# Patient Record
Sex: Male | Born: 1995 | Race: White | Hispanic: No | Marital: Single | Smoking: Never smoker
Health system: Southern US, Community
[De-identification: ages and names within clinical notes are randomized; demographics above are authoritative.]

---
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
## 2017-02-02 ENCOUNTER — Emergency Department (HOSPITAL_COMMUNITY)
Admission: EM | Admit: 2017-02-02 | Discharge: 2017-02-02 | Disposition: A | Discharge status: Home / Self Care | Payer: Medicaid Other | Attending: Emergency Medicine | Admitting: Emergency Medicine

## 2017-02-02 ENCOUNTER — Emergency Department (HOSPITAL_COMMUNITY): Payer: Medicaid Other

## 2017-02-02 ENCOUNTER — Encounter (HOSPITAL_COMMUNITY): Payer: Self-pay | Admitting: Emergency Medicine

## 2017-02-02 DIAGNOSIS — Y9289 Other specified places as the place of occurrence of the external cause: Secondary | ICD-10-CM | POA: Diagnosis not present

## 2017-02-02 DIAGNOSIS — S93401A Sprain of unspecified ligament of right ankle, initial encounter: Secondary | ICD-10-CM | POA: Insufficient documentation

## 2017-02-02 DIAGNOSIS — Y998 Other external cause status: Secondary | ICD-10-CM | POA: Insufficient documentation

## 2017-02-02 DIAGNOSIS — Y9367 Activity, basketball: Secondary | ICD-10-CM | POA: Diagnosis not present

## 2017-02-02 DIAGNOSIS — S99911A Unspecified injury of right ankle, initial encounter: Secondary | ICD-10-CM | POA: Diagnosis present

## 2017-02-02 DIAGNOSIS — X501XXA Overexertion from prolonged static or awkward postures, initial encounter: Secondary | ICD-10-CM | POA: Insufficient documentation

## 2017-02-02 MED ORDER — ACETAMINOPHEN 325 MG PO TABS
650.0000 mg | ORAL_TABLET | Freq: Once | ORAL | Status: AC
  Administered 2017-02-02: 650 mg via ORAL
  Filled 2017-02-02: qty 2

## 2017-02-02 MED ORDER — IBUPROFEN 800 MG PO TABS
800.0000 mg | ORAL_TABLET | Freq: Once | ORAL | Status: AC
  Administered 2017-02-02: 800 mg via ORAL
  Filled 2017-02-02: qty 1

## 2017-02-02 MED ORDER — IBUPROFEN 600 MG PO TABS: 600.0000 mg | ORAL_TABLET | Freq: Four times a day (QID) | ORAL | 0 refills | Status: DC

## 2017-02-02 NOTE — ED Triage Notes (Signed)
Pt states he was playing basketball and had pain in right foot and swelling

## 2017-02-02 NOTE — Discharge Instructions (Signed)
Your vital signs within normal limits. Your x-ray is negative for fracture or dislocation. Your examination favors a sprain of the ankle. Please use an ankle stirrup splint for the next 5-7 days. Use the crutches until you can safely apply weight to your right lower extremity. Please apply ice over the next 48-72 hours. Elevate your foot above your waist when resting or sitting. Use 600 mg of ibuprofen and 500 mg of Tylenol every 6 hours for soreness. Please see Dr. Romeo AppleHarrison or member of his team concerning your ankle sprain especially since you have had multiple sprains recently.

## 2017-02-02 NOTE — ED Provider Notes (Signed)
[facility]-EMERGENCY DEPT Provider Note   CSN: [id number] Arrival date & time: 02/02/17  2032     History   Chief Complaint Chief Complaint  Patient presents with  . Foot Pain    HPI Nathaniel Nunez is a 21 y.o. male.   Ankle Pain   The incident occurred 1 to 2 hours ago. The incident occurred at the gym. Injury mechanism: twisted ankle while playing basketball. The pain is present in the right ankle. The pain is moderate. The pain has been constant since onset. Associated symptoms include inability to bear weight. Pertinent negatives include no loss of sensation. He reports no foreign bodies present. The symptoms are aggravated by bearing weight. He has tried nothing for the symptoms.    History reviewed. No pertinent past medical history.  There are no active problems to display for this patient.   History reviewed. No pertinent surgical history.     Home Medications    Prior to Admission medications   Medication Sig Start Date End Date Taking? Authorizing Provider  HYDROcodone-acetaminophen (NORCO/VICODIN) 5-325 MG per tablet Take 1 tablet by mouth every 4 (four) hours as needed. 03/08/15   Janne Napoleon, NP  naproxen (NAPROSYN) 500 MG tablet Take 1 tablet (500 mg total) by mouth 2 (two) times daily. 03/08/15   Janne Napoleon, NP    Family History History reviewed. No pertinent family history.  Social History Social History  Substance Use Topics  . Smoking status: Never Smoker  . Smokeless tobacco: Never Used  . Alcohol use No     Allergies   Patient has no known allergies.   Review of Systems Review of Systems  Constitutional: Negative for activity change.       All ROS Neg except as noted in HPI  HENT: Negative for nosebleeds.   Eyes: Negative for photophobia and discharge.  Respiratory: Negative for cough, shortness of breath and wheezing.   Cardiovascular: Negative for chest pain and palpitations.  Gastrointestinal: Negative for abdominal pain and blood in  stool.  Genitourinary: Negative for dysuria, frequency and hematuria.  Musculoskeletal: Positive for arthralgias. Negative for back pain and neck pain.  Skin: Negative.   Neurological: Negative for dizziness, seizures and speech difficulty.  Psychiatric/Behavioral: Negative for confusion and hallucinations.     Physical Exam Updated Vital Signs BP 129/72 (BP Location: Left Arm)   Pulse 98   Temp 98.4 F (36.9 C) (Oral)   Resp 18   Ht 6' (1.829 m)   Wt 81.6 kg (180 lb)   SpO2 97%   BMI 24.41 kg/m   Physical Exam  Constitutional: He is oriented to person, place, and time. He appears well-developed and well-nourished.  Non-toxic appearance.  HENT:  Head: Normocephalic.  Right Ear: Tympanic membrane and external ear normal.  Left Ear: Tympanic membrane and external ear normal.  Eyes: EOM and lids are normal. Pupils are equal, round, and reactive to light.  Neck: Normal range of motion. Neck supple. Carotid bruit is not present.  Cardiovascular: Normal rate, regular rhythm, normal heart sounds, intact distal pulses and normal pulses.   Pulmonary/Chest: Breath sounds normal. No respiratory distress.  Abdominal: Soft. Bowel sounds are normal. There is no tenderness. There is no guarding.  Musculoskeletal:       Right ankle: He exhibits decreased range of motion and swelling. Tenderness. Lateral malleolus tenderness found. Achilles tendon normal.       Feet:  Achilles tendon intact.  Lymphadenopathy:       Head (  right side): No submandibular adenopathy present.       Head (left side): No submandibular adenopathy present.    He has no cervical adenopathy.  Neurological: He is alert and oriented to person, place, and time. He has normal strength. No cranial nerve deficit or sensory deficit.  Skin: Skin is warm and dry.  Psychiatric: He has a normal mood and affect. His speech is normal.  Nursing note and vitals reviewed.    ED Treatments / Results  Labs (all labs ordered are  listed, but only abnormal results are displayed) Labs Reviewed - No data to display  EKG  EKG Interpretation None       Radiology Dg Foot Complete Right  Result Date: 02/02/2017 CLINICAL DATA:  Pain across top of foot after twisting during basketball. EXAM: RIGHT FOOT COMPLETE - 3+ VIEW COMPARISON:  None. FINDINGS: No fracture, dislocation, or other abnormality. IMPRESSION: Negative. Electronically Signed   By: Gerome Samavid  Williams III M.D   On: 02/02/2017 21:25    Procedures Procedures (including critical care time)  Medications Ordered in ED Medications  ibuprofen (ADVIL,MOTRIN) tablet 800 mg (not administered)  acetaminophen (TYLENOL) tablet 650 mg (not administered)     Initial Impression / Assessment and Plan / ED Course  I have reviewed the triage vital signs and the nursing notes.  Pertinent labs & imaging results that were available during my care of the patient were reviewed by me and considered in my medical decision making (see chart for details).       Final Clinical Impressions(s) / ED Diagnoses MDM Vital signs within normal limits. X-ray of the right foot and ankle is negative for fracture or dislocation. The examination favors ankle sprain. Patient fitted with an ankle stirrup splint and crutches. Ice pack applied. Patient is to follow-up with Dr. Romeo AppleHarrison for additional evaluation and management, he specially since this is his third injury of this type to the same ankle.    Final diagnoses:  Sprain of right ankle, unspecified ligament, initial encounter    New Prescriptions New Prescriptions   No medications on file     Ivery QualeBryant, Mikahla Wisor, Cordelia Poche-C 02/02/17 2200    Ivery QualeBryant, Gentry Pilson, PA-C 02/02/17 2202    Lavera GuiseLiu, Dana Duo, MD 02/06/17 1355

## 2017-02-10 ENCOUNTER — Emergency Department (HOSPITAL_COMMUNITY): Payer: Medicaid Other

## 2017-02-10 ENCOUNTER — Encounter (HOSPITAL_COMMUNITY): Payer: Self-pay

## 2017-02-10 ENCOUNTER — Emergency Department (HOSPITAL_COMMUNITY)
Admission: EM | Admit: 2017-02-10 | Discharge: 2017-02-10 | Disposition: A | Discharge status: Home / Self Care | Payer: Medicaid Other | Attending: Emergency Medicine | Admitting: Emergency Medicine

## 2017-02-10 DIAGNOSIS — Y92310 Basketball court as the place of occurrence of the external cause: Secondary | ICD-10-CM | POA: Diagnosis not present

## 2017-02-10 DIAGNOSIS — S93432A Sprain of tibiofibular ligament of left ankle, initial encounter: Secondary | ICD-10-CM | POA: Insufficient documentation

## 2017-02-10 DIAGNOSIS — Y9367 Activity, basketball: Secondary | ICD-10-CM | POA: Insufficient documentation

## 2017-02-10 DIAGNOSIS — W010XXA Fall on same level from slipping, tripping and stumbling without subsequent striking against object, initial encounter: Secondary | ICD-10-CM | POA: Diagnosis not present

## 2017-02-10 DIAGNOSIS — Z79899 Other long term (current) drug therapy: Secondary | ICD-10-CM | POA: Insufficient documentation

## 2017-02-10 DIAGNOSIS — S99912A Unspecified injury of left ankle, initial encounter: Secondary | ICD-10-CM | POA: Diagnosis present

## 2017-02-10 DIAGNOSIS — Y998 Other external cause status: Secondary | ICD-10-CM | POA: Insufficient documentation

## 2017-02-10 DIAGNOSIS — S93492A Sprain of other ligament of left ankle, initial encounter: Secondary | ICD-10-CM

## 2017-02-10 MED ORDER — IBUPROFEN 800 MG PO TABS
800.0000 mg | ORAL_TABLET | Freq: Once | ORAL | Status: AC
  Administered 2017-02-10: 800 mg via ORAL
  Filled 2017-02-10: qty 1

## 2017-02-10 MED ORDER — IBUPROFEN 600 MG PO TABS: 600.0000 mg | ORAL_TABLET | Freq: Four times a day (QID) | ORAL | 0 refills | Status: DC | PRN

## 2017-02-10 NOTE — ED Provider Notes (Signed)
[facility]-EMER[facility]Y DEPT Provider Note   CSN: [id number] Arrival date & time: 02/10/17  2109     History   Chief Complaint Chief Complaint  Patient presents with  . Ankle Pain    HPI Nathaniel Nunez is a [age] y.o. male.  HPI Nathaniel Nunez is a 21 y.o. male presents to emergency department with complaint of left ankle injury. Patient states he was playing basketball when he jumped up to get the ball and when he landed he is ankle twisted under. He reports being here in emergency department just 3 days ago with a right ankle injury which is doing better. He reports swelling to the ankle. No treatment prior to coming in. Unable to ambulate on that leg due to pain. No numbness or tingling in his foot. Did not take any medications. No history of fracture to that ankle in the past. Denies pain at the knee.  History reviewed. No pertinent past medical history.  There are no active problems to display for this patient.   History reviewed. No pertinent surgical history.     Home Medications    Prior to Admission medications   Medication Sig Start Date End Date Taking? Authorizing Provider  HYDROcodone-acetaminophen (NORCO/VICODIN) 5-325 MG per tablet Take 1 tablet by mouth every 4 (four) hours as needed. 03/08/15   Janne NapoleonNeese, Hope M, NP  ibuprofen (ADVIL,MOTRIN) 600 MG tablet Take 1 tablet (600 mg total) by mouth 4 (four) times daily. 02/02/17   Ivery QualeBryant, Hobson, PA-C  naproxen (NAPROSYN) 500 MG tablet Take 1 tablet (500 mg total) by mouth 2 (two) times daily. 03/08/15   Janne NapoleonNeese, Hope M, NP    Family History No family history on file.  Social History Social History  Substance Use Topics  . Smoking status: Never Smoker  . Smokeless tobacco: Never Used  . Alcohol use No     Allergies   Patient has no known allergies.   Review of Systems Review of Systems  Musculoskeletal: Positive for arthralgias and joint swelling.  Neurological: Negative for weakness and numbness.  All other systems  reviewed and are negative.    Physical Exam Updated Vital Signs BP (!) 111/59   Ht 6' (1.829 m)   Wt 81.6 kg (180 lb)   BMI 24.41 kg/m   Physical Exam  Constitutional: He is oriented to person, place, and time. He appears well-developed and well-nourished. No distress.  Eyes: Conjunctivae are normal.  Neck: Neck supple.  Cardiovascular: Normal rate.   Pulmonary/Chest: No respiratory distress.  Abdominal: He exhibits no distension.  Musculoskeletal:  Significant swelling to the left lateral ankle. No tenderness over proximal fibula or joint of the left knee. No tenderness of the medial malleolus. Tenderness over lateral malleolus. Normal foot. Normal range of motion of all toes. Dorsal pedal pulses intact. Capillary refill less than 2 seconds. Pain with any range of motion of the ankle joint.  Neurological: He is alert and oriented to person, place, and time.  Skin: Skin is warm and dry.  Nursing note and vitals reviewed.    ED Treatments / Results  Labs (all labs ordered are listed, but only abnormal results are displayed) Labs Reviewed - No data to display  EKG  EKG Interpretation None       Radiology Dg Ankle Complete Left  Result Date: 02/10/2017 CLINICAL DATA:  Initial evaluation for acute left ankle pain with swelling. EXAM: LEFT ANKLE COMPLETE - 3+ VIEW COMPARISON:  None. FINDINGS: No acute fracture or dislocation. Ankle mortise  approximated. Talar dome intact. Prominent soft tissue swelling present about the ankle, greatest overlying the lateral malleolus. Probable joint effusion. IMPRESSION: 1. No acute fracture or dislocation. 2. Prominent soft tissue swelling about the ankle, greatest overlying the lateral malleolus. Electronically Signed   By: Rise Mu M.D.   On: 02/10/2017 22:04    Procedures Procedures (including critical care time)  Medications Ordered in ED Medications - No data to display   Initial Impression / Assessment and Plan / ED  Course  I have reviewed the triage vital signs and the nursing notes.  Pertinent labs & imaging results that were available during my care of the patient were reviewed by me and considered in my medical decision making (see chart for details).     Patient with left ankle injury. X-ray negative for fracture. Placed an ASO brace, crutches, NSAIDs. Neurovascularly intact.  Most likely a sprain. Follow-up with family doctor. Return precautions discussed.  Vitals:   02/10/17 2138 02/10/17 2139  BP:  (!) 111/59  Weight: 81.6 kg (180 lb)   Height: 6' (1.829 m)      Final Clinical Impressions(s) / ED Diagnoses   Final diagnoses:  Sprain of posterior talofibular ligament of left ankle, initial encounter    New Prescriptions Discharge Medication List as of 02/10/2017 10:15 PM    START taking these medications   Details  !! ibuprofen (ADVIL,MOTRIN) 600 MG tablet Take 1 tablet (600 mg total) by mouth every 6 (six) hours as needed., Starting Sat 02/10/2017, Print     !! - Potential duplicate medications found. Please discuss with provider.       Jaynie Crumble, PA-C 02/11/17 1610    Eber Hong, MD 02/11/17 [phone]

## 2017-02-10 NOTE — ED Notes (Signed)
To radiology

## 2017-02-10 NOTE — Discharge Instructions (Signed)
Ibuprofen for pain. Keep ankle elevated at home, ice several times a day. ASO brace for support. Crutches for several days. No sports for 1-2 weeks. If not improving, follow up with family doctor for recheck.

## 2017-02-10 NOTE — ED Triage Notes (Signed)
Reports of playing basketball and twisting left ankle. Swelling noted to left ankle.

## 2017-02-10 NOTE — ED Notes (Signed)
Playing basketball and rolled left ankle now with pain and swelling to same.  He has been evaluated by the PA

## 2017-02-10 NOTE — ED Notes (Signed)
From Xray 

## 2019-03-27 ENCOUNTER — Emergency Department (HOSPITAL_COMMUNITY): Payer: Self-pay

## 2019-03-27 ENCOUNTER — Emergency Department (HOSPITAL_COMMUNITY)
Admission: EM | Admit: 2019-03-27 | Discharge: 2019-03-27 | Disposition: A | Discharge status: Home / Self Care | Payer: Self-pay | Attending: Emergency Medicine | Admitting: Emergency Medicine

## 2019-03-27 ENCOUNTER — Other Ambulatory Visit: Payer: Self-pay

## 2019-03-27 ENCOUNTER — Encounter (HOSPITAL_COMMUNITY): Payer: Self-pay | Admitting: Emergency Medicine

## 2019-03-27 DIAGNOSIS — S93402A Sprain of unspecified ligament of left ankle, initial encounter: Secondary | ICD-10-CM | POA: Insufficient documentation

## 2019-03-27 DIAGNOSIS — R55 Syncope and collapse: Secondary | ICD-10-CM | POA: Insufficient documentation

## 2019-03-27 DIAGNOSIS — Y999 Unspecified external cause status: Secondary | ICD-10-CM | POA: Insufficient documentation

## 2019-03-27 DIAGNOSIS — Y9367 Activity, basketball: Secondary | ICD-10-CM | POA: Insufficient documentation

## 2019-03-27 DIAGNOSIS — Y929 Unspecified place or not applicable: Secondary | ICD-10-CM | POA: Insufficient documentation

## 2019-03-27 DIAGNOSIS — X509XXA Other and unspecified overexertion or strenuous movements or postures, initial encounter: Secondary | ICD-10-CM | POA: Insufficient documentation

## 2019-03-27 LAB — CBC WITH DIFFERENTIAL/PLATELET
Abs Immature Granulocytes: 0.06 10*3/uL (ref 0.00–0.07)
Basophils Absolute: 0 10*3/uL (ref 0.0–0.1)
Basophils Relative: 0 %
Eosinophils Absolute: 0.2 10*3/uL (ref 0.0–0.5)
Eosinophils Relative: 1 %
HCT: 46.7 % (ref 39.0–52.0)
Hemoglobin: 15.7 g/dL (ref 13.0–17.0)
Immature Granulocytes: 0 %
Lymphocytes Relative: 18 %
Lymphs Abs: 2.5 10*3/uL (ref 0.7–4.0)
MCH: 31.3 pg (ref 26.0–34.0)
MCHC: 33.6 g/dL (ref 30.0–36.0)
MCV: 93.2 fL (ref 80.0–100.0)
Monocytes Absolute: 0.8 10*3/uL (ref 0.1–1.0)
Monocytes Relative: 6 %
Neutro Abs: 10.2 10*3/uL — ABNORMAL HIGH (ref 1.7–7.7)
Neutrophils Relative %: 75 %
Platelets: 224 10*3/uL (ref 150–400)
RBC: 5.01 MIL/uL (ref 4.22–5.81)
RDW: 11.7 % (ref 11.5–15.5)
WBC: 13.8 10*3/uL — ABNORMAL HIGH (ref 4.0–10.5)
nRBC: 0 % (ref 0.0–0.2)

## 2019-03-27 LAB — CBG MONITORING, ED: Glucose-Capillary: 88 mg/dL (ref 70–99)

## 2019-03-27 LAB — BASIC METABOLIC PANEL
Anion gap: 10 (ref 5–15)
BUN: 11 mg/dL (ref 6–20)
CO2: 24 mmol/L (ref 22–32)
Calcium: 9.5 mg/dL (ref 8.9–10.3)
Chloride: 103 mmol/L (ref 98–111)
Creatinine, Ser: 1.3 mg/dL — ABNORMAL HIGH (ref 0.61–1.24)
GFR calc Af Amer: >60 mL/min (ref >60)
GFR calc non Af Amer: >60 mL/min (ref >60)
Glucose, Bld: 103 mg/dL — ABNORMAL HIGH (ref 70–99)
Potassium: 3.4 mmol/L — ABNORMAL LOW (ref 3.5–5.1)
Sodium: 137 mmol/L (ref 135–145)

## 2019-03-27 LAB — TROPONIN I (HIGH SENSITIVITY): Troponin I (High Sensitivity): 2 ng/L (ref <18)

## 2019-03-27 MED ORDER — SODIUM CHLORIDE 0.9 % IV BOLUS
1000.0000 mL | Freq: Once | INTRAVENOUS | Status: AC
  Administered 2019-03-27: 19:00:00 1000 mL via INTRAVENOUS

## 2019-03-27 MED ORDER — IBUPROFEN 800 MG PO TABS: 800.0000 mg | ORAL_TABLET | Freq: Three times a day (TID) | ORAL | 0 refills | Status: AC | PRN

## 2019-03-27 NOTE — Discharge Instructions (Signed)
You were seen in the emergency department today with a left ankle injury.  You do not have a fracture or break in the bone.  Please use the splint as needed for comfort.  You may remove it if your ankle begins to feel better.  Use the crutches to keep weight off the ankle as it is comfortable for you.  You may put weight on the ankle as you desire.  Take Tylenol and/or Motrin as needed for pain.  Keep the ankle elevated.  I suspect that your episode of passing out today was related to severe pain.  Your blood work and EKG look normal.  Please return to the emergency department with any additional passing out.

## 2019-03-27 NOTE — ED Notes (Signed)
Patient transported to X-ray 

## 2019-03-27 NOTE — ED Triage Notes (Signed)
Pt reports that he was playing basketball when he came down on left foot and heard a pop. Pt c/o LT ankle pain and swelling. Pt states he went home and was sitting down when he "blacked out." Family told him he was "out" for about 15 seconds. AOx4 at this time.

## 2019-03-27 NOTE — ED Notes (Signed)
ED Provider at bedside. 

## 2019-03-27 NOTE — ED Provider Notes (Signed)
Emergency Department Provider Note   I have reviewed the triage vital signs and the nursing notes.   HISTORY  Chief Complaint Ankle Injury   HPI Notnamed Nathaniel Nunez is a 23 y.o. male presents to the emergency department for evaluation of severe left ankle pain.  Patient states that while playing basketball he landed on the left foot and felt severe pain in the ankle.  He notes some swelling.  Patient states that he was walking back home and having severe pain when he suddenly "blacked out" for approximately 15 seconds.  He was accompanied by friends he denied any seizure activity.  Patient states right before he passed out he was having severe pain in his ankle.  He denies any chest pain, shortness of breath, or palpitations.  No history of similar symptoms in the past.  History reviewed. No pertinent past medical history.  There are no active problems to display for this patient.   History reviewed. No pertinent surgical history.  Allergies Patient has no known allergies.  No family history on file.  Social History Social History   Tobacco Use  . Smoking status: Never Smoker  . Smokeless tobacco: Never Used  Substance Use Topics  . Alcohol use: No  . Drug use: No    Review of Systems  Constitutional: No fever/chills Eyes: No visual changes. ENT: No sore throat. Cardiovascular: Denies chest pain. Positive syncope.  Respiratory: Denies shortness of breath. Gastrointestinal: No abdominal pain.  No nausea, no vomiting.  No diarrhea.  No constipation. Genitourinary: Negative for dysuria. Musculoskeletal: Negative for back pain. Positive left ankle pain and swelling.  Skin: Negative for rash. Neurological: Negative for headaches, focal weakness or numbness.  10-point ROS otherwise negative.  ____________________________________________   PHYSICAL EXAM:  VITAL SIGNS: ED Triage Vitals  Enc Vitals Group     BP 03/27/19 1751 127/74     Pulse Rate 03/27/19 1751 (!)  54     Resp 03/27/19 1751 20     Temp 03/27/19 1751 98.4 F (36.9 C)     Temp Source 03/27/19 1751 Oral     SpO2 03/27/19 1751 100 %     Weight 03/27/19 1751 185 lb (83.9 kg)     Height 03/27/19 1751 6' (1.829 m)   Constitutional: Alert and oriented. Well appearing and in no acute distress. Eyes: Conjunctivae are normal. Head: Atraumatic. Nose: No congestion/rhinnorhea. Mouth/Throat: Mucous membranes are moist.   Neck: No stridor.  Cardiovascular: Normal rate, regular rhythm. Good peripheral circulation. Grossly normal heart sounds.   Respiratory: Normal respiratory effort.  No retractions. Lungs CTAB. Gastrointestinal: Soft and nontender. No distention.  Musculoskeletal: Very mild swelling noted over the left ankle.  No deformity or open injury.  No proximal fibular tenderness.  Mild discomfort in the distal tib-fib but more diffuse as opposed to focal finding.  No foot, knee, hip discomfort with palpation/range of motion.  Neurologic:  Normal speech and language. No gross focal neurologic deficits are appreciated.  Skin:  Skin is warm, dry and intact. No rash noted.  ____________________________________________   LABS (all labs ordered are listed, but only abnormal results are displayed)  Labs Reviewed  BASIC METABOLIC PANEL - Abnormal; Notable for the following components:      Result Value   Potassium 3.4 (*)    Glucose, Bld 103 (*)    Creatinine, Ser 1.30 (*)    All other components within normal limits  CBC WITH DIFFERENTIAL/PLATELET - Abnormal; Notable for the following components:  WBC 13.8 (*)    Neutro Abs 10.2 (*)    All other components within normal limits  CBG MONITORING, ED  TROPONIN I (HIGH SENSITIVITY)   ____________________________________________  EKG   EKG Interpretation  Date/Time:  Thursday March 27 2019 17:49:36 EDT Ventricular Rate:  58 PR Interval:    QRS Duration: 84 QT Interval:  410 QTC Calculation: 403 R Axis:   87 Text  Interpretation:  Sinus rhythm RAE, consider biatrial enlargement Probable left ventricular hypertrophy No STEMI  Confirmed by Alona BeneLong, Joshua [phone]([id number]) on 03/27/2019 5:54:09 PM       ____________________________________________  RADIOLOGY  Dg Chest 2 View  Result Date: 03/27/2019 CLINICAL DATA:  Twisting right ankle injury last night. Shortness of breath and sweating with lightheadedness. EXAM: CHEST - 2 VIEW COMPARISON:  None. FINDINGS: Lungs are clear. Cardiomediastinal silhouette and remainder of the exam is unremarkable. IMPRESSION: No active cardiopulmonary disease. Electronically Signed   By: Elberta Fortisaniel  Boyle M.D.   On: 03/27/2019 18:24   Dg Tibia/fibula Left  Result Date: 03/27/2019 CLINICAL DATA:  Twisted left ankle playing basketball yesterday with lower leg pain. EXAM: LEFT TIBIA AND FIBULA - 2 VIEW COMPARISON:  None. FINDINGS: There is no evidence of fracture or other focal bone lesions. Soft tissues are unremarkable. IMPRESSION: Negative. Electronically Signed   By: Elberta Fortisaniel  Boyle M.D.   On: 03/27/2019 18:23   Dg Ankle Complete Left  Result Date: 03/27/2019 CLINICAL DATA:  Twisted left ankle playing basketball yesterday with pain. EXAM: LEFT ANKLE COMPLETE - 3+ VIEW COMPARISON:  None. FINDINGS: There is no evidence of fracture, dislocation, or joint effusion. There is no evidence of arthropathy or other focal bone abnormality. Soft tissues are unremarkable. IMPRESSION: Negative. Electronically Signed   By: Elberta Fortisaniel  Boyle M.D.   On: 03/27/2019 18:22    ____________________________________________   PROCEDURES  Procedure(s) performed:   Procedures  None ____________________________________________   INITIAL IMPRESSION / ASSESSMENT AND PLAN / ED COURSE  Pertinent labs & imaging results that were available during my care of the patient were reviewed by me and considered in my medical decision making (see chart for details).   Patient presents to the emergency department for  evaluation of left ankle injury with likely vasovagal syncope related to severe pain.  I am screening for other causes of cardiogenic syncope.  EKG shows early repolarization type pattern.  Normal intervals.  No evidence of channelopathy.  Will obtain screening labs, give IV fluids.  Plain films show no acute fracture or dislocation of the ankle.  Normal tib-fib.  Normal chest x-ray with normal cardiac silhouette.   Labs and imaging reviewed. No acute findings. Crutches and air splint provided for comfort. Discussed RICE mgmt at home. Discussed calling tomorrow to make appointment with PCP.  ____________________________________________  FINAL CLINICAL IMPRESSION(S) / ED DIAGNOSES  Final diagnoses:  Syncope, unspecified syncope type  Sprain of left ankle, unspecified ligament, initial encounter     MEDICATIONS GIVEN DURING THIS VISIT:  Medications  sodium chloride 0.9 % bolus 1,000 mL (0 mLs Intravenous Stopped 03/27/19 1931)     NEW OUTPATIENT MEDICATIONS STARTED DURING THIS VISIT:  Discharge Medication List as of 03/27/2019  8:04 PM    START taking these medications   Details  ibuprofen (ADVIL) 800 MG tablet Take 1 tablet (800 mg total) by mouth every 8 (eight) hours as needed for moderate pain., Starting Thu 03/27/2019, Print        Note:  This document was prepared using Conservation officer, historic buildingsDragon voice recognition software  and may include unintentional dictation errors.  Alona BeneJoshua Long, MD Emergency Medicine    Long, Arlyss RepressJoshua G, MD 03/28/19 [phone]

## 2019-12-16 ENCOUNTER — Encounter (HOSPITAL_COMMUNITY): Payer: Self-pay

## 2019-12-16 ENCOUNTER — Emergency Department (HOSPITAL_COMMUNITY)
Admission: EM | Admit: 2019-12-16 | Discharge: 2019-12-16 | Disposition: A | Discharge status: Home / Self Care | Payer: Self-pay | Attending: Emergency Medicine | Admitting: Emergency Medicine

## 2019-12-16 ENCOUNTER — Emergency Department (HOSPITAL_COMMUNITY): Payer: Self-pay

## 2019-12-16 ENCOUNTER — Other Ambulatory Visit: Payer: Self-pay

## 2019-12-16 DIAGNOSIS — Z23 Encounter for immunization: Secondary | ICD-10-CM | POA: Insufficient documentation

## 2019-12-16 DIAGNOSIS — Y929 Unspecified place or not applicable: Secondary | ICD-10-CM | POA: Insufficient documentation

## 2019-12-16 DIAGNOSIS — S81012A Laceration without foreign body, left knee, initial encounter: Secondary | ICD-10-CM | POA: Insufficient documentation

## 2019-12-16 DIAGNOSIS — Y999 Unspecified external cause status: Secondary | ICD-10-CM | POA: Insufficient documentation

## 2019-12-16 DIAGNOSIS — W228XXA Striking against or struck by other objects, initial encounter: Secondary | ICD-10-CM | POA: Insufficient documentation

## 2019-12-16 DIAGNOSIS — Y939 Activity, unspecified: Secondary | ICD-10-CM | POA: Insufficient documentation

## 2019-12-16 MED ORDER — LIDOCAINE-EPINEPHRINE-TETRACAINE (LET) TOPICAL GEL
3.0000 mL | Freq: Once | TOPICAL | Status: AC
  Administered 2019-12-16: 3 mL via TOPICAL
  Filled 2019-12-16: qty 3

## 2019-12-16 MED ORDER — LIDOCAINE-EPINEPHRINE (PF) 1 %-1:200000 IJ SOLN
10.0000 mL | Freq: Once | INTRAMUSCULAR | Status: DC
  Filled 2019-12-16: qty 30

## 2019-12-16 MED ORDER — OXYCODONE-ACETAMINOPHEN 5-325 MG PO TABS
1.0000 | ORAL_TABLET | Freq: Once | ORAL | Status: AC
  Administered 2019-12-16: 1 via ORAL
  Filled 2019-12-16: qty 1

## 2019-12-16 MED ORDER — TETANUS-DIPHTH-ACELL PERTUSSIS 5-2.5-18.5 LF-MCG/0.5 IM SUSP
0.5000 mL | Freq: Once | INTRAMUSCULAR | Status: AC
Start: 2019-12-16 — End: 2019-12-16
  Administered 2019-12-16: 0.5 mL via INTRAMUSCULAR
  Filled 2019-12-16: qty 0.5

## 2019-12-16 NOTE — Discharge Instructions (Addendum)
You have been diagnosed today with laceration of the left knee.  At this time there does not appear to be the presence of an emergent medical condition, however there is always the potential for conditions to change. Please read and follow the below instructions.  Please return to the Emergency Department immediately for any new or worsening symptoms. Please be sure to follow up with your Primary Care Provider within one week regarding your visit today; please call their office to schedule an appointment even if you are feeling better for a follow-up visit. Your 7 stitches will need to be removed and 8-10 days.  They may be removed at your primary care doctor's office, and urgent care or here at the emergency department. You may use the crutches and knee immobilizer to help protect your knee.  You may follow-up with the orthopedic specialist Dr. Romeo Apple if you have continued knee pain.  Use rest, ice and elevation to help with your symptoms.  Get help right away if: You have very bad swelling around the wound. Your pain suddenly gets worse and is very bad. You notice painful lumps near the wound or anywhere on your body. You have a red streak going away from your wound. The wound is on your hand or foot, and: You cannot move a finger or toe. Your fingers or toes look pale or bluish. You have fever or chills You have any new/concerning or worsening of symptoms  Please read the additional information packets attached to your discharge summary.  Do not take your medicine if  develop an itchy rash, swelling in your mouth or lips, or difficulty breathing; call 911 and seek immediate emergency medical attention if this occurs.  Note: Portions of this text may have been transcribed using voice recognition software. Every effort was made to ensure accuracy; however, inadvertent computerized transcription errors may still be present.

## 2019-12-16 NOTE — ED Provider Notes (Signed)
[facility] EMERGENCY DEPARTMENT Provider Note   CSN: [id number] Arrival date & time: 12/16/19  1246     History Chief Complaint  Patient presents with  . Laceration    Nathaniel Nunez is a 24 y.o. male otherwise healthy no daily medication use.  Patient fell on the second step around 1 PM today striking his knee on the edge of a metal step.  Reports immediate onset sharp pain constant worsened with movement nonradiating and moderate-severe in intensity.  He noticed bleeding immediately, direct pressure was applied and his friends brought him to this ER for evaluation.  Reports unknown last tetanus shot believes it was when he was "a little kid".  Denies head injury, headache, loss consciousness, blood thinner use, neck pain, chest pain, abdominal pain, back pain, numbness/weakness, tingling or any additional concerns.  HPI     History reviewed. No pertinent past medical history.  There are no problems to display for this patient.   History reviewed. No pertinent surgical history.     No family history on file.  Social History   Tobacco Use  . Smoking status: Never Smoker  . Smokeless tobacco: Never Used  Substance Use Topics  . Alcohol use: No  . Drug use: No    Home Medications Prior to Admission medications   Medication Sig Start Date End Date Taking? Authorizing Provider  ibuprofen (ADVIL) 800 MG tablet Take 1 tablet (800 mg total) by mouth every 8 (eight) hours as needed for moderate pain. 03/27/19   Long, Arlyss Repress, MD    Allergies    Patient has no known allergies.  Review of Systems   Review of Systems  Cardiovascular: Negative.  Negative for chest pain.  Gastrointestinal: Negative.  Negative for abdominal pain.  Musculoskeletal: Positive for arthralgias (Left knee). Negative for back pain and neck pain.  Skin: Positive for wound (Left knee laceration).  Neurological: Negative.  Negative for syncope, weakness, numbness and headaches.    Physical  Exam Updated Vital Signs BP 134/64 (BP Location: Right Arm)   Pulse 75   Temp 98 F (36.7 C) (Oral)   Resp 18   Ht 6' (1.829 m)   Wt 81.6 kg   SpO2 97%   BMI 24.41 kg/m   Physical Exam Constitutional:      General: He is not in acute distress.    Appearance: Normal appearance. He is well-developed. He is not ill-appearing or diaphoretic.  HENT:     Head: Normocephalic and atraumatic. No raccoon eyes, Battle's sign, abrasion or contusion.     Jaw: There is normal jaw occlusion. No trismus.     Right Ear: Tympanic membrane and external ear normal. No hemotympanum.     Left Ear: Tympanic membrane and external ear normal. No hemotympanum.     Nose: Nose normal.     Right Nostril: No epistaxis.     Left Nostril: No epistaxis.     Mouth/Throat:     Mouth: Mucous membranes are moist.     Pharynx: Oropharynx is clear.     Comments: No evidence of dental injury Eyes:     General: Vision grossly intact. Gaze aligned appropriately.     Extraocular Movements: Extraocular movements intact.     Conjunctiva/sclera: Conjunctivae normal.     Pupils: Pupils are equal, round, and reactive to light.  Neck:     Trachea: Trachea and phonation normal. No tracheal tenderness or tracheal deviation.  Cardiovascular:     Rate and Rhythm: Normal rate  and regular rhythm.     Pulses:          Dorsalis pedis pulses are 2+ on the right side and 2+ on the left side.  Pulmonary:     Effort: Pulmonary effort is normal. No accessory muscle usage or respiratory distress.     Breath sounds: Normal air entry.  Chest:     Chest wall: No deformity, tenderness or crepitus.     Comments: No evidence of injury to the chest Abdominal:     General: There is no distension.     Palpations: Abdomen is soft.     Tenderness: There is no abdominal tenderness. There is no guarding or rebound.     Comments: No evidence of injury to the abdomen  Musculoskeletal:        General: Normal range of motion.     Cervical  back: Full passive range of motion without pain, normal range of motion and neck supple. No spinous process tenderness or muscular tenderness.     Comments: Full range of motion of the upper extremities without pain. - No midline C/T/L spinal tenderness to palpation, no paraspinal muscle tenderness, no deformity, crepitus, or step-off noted. No sign of injury to the neck or back. - Left knee: Tenderness to the patella with 4.5 cm laceration overlying the inferior patella.  Flexion extension at the knee is intact with minimal increase in pain.  Capillary refill sensation intact to toes, strong and equal pedal pulses, range of motion at the hip and ankle intact without pain.  Feet:     Right foot:     Protective Sensation: 5 sites tested. 5 sites sensed.     Left foot:     Protective Sensation: 5 sites tested. 5 sites sensed.  Skin:    General: Skin is warm and dry.     Capillary Refill: Capillary refill takes less than 2 seconds.          Comments: 4.5 cm laceration  Neurological:     Mental Status: He is alert.     GCS: GCS eye subscore is 4. GCS verbal subscore is 5. GCS motor subscore is 6.     Comments: Speech is clear and goal oriented, follows commands Major Cranial nerves without deficit, no facial droop Normal strength in upper and lower extremities bilaterally including dorsiflexion and plantar flexion, strong and equal grip strength Sensation normal to light and sharp touch Moves extremities without ataxia, coordination intact  Psychiatric:        Behavior: Behavior normal.     ED Results / Procedures / Treatments   Labs (all labs ordered are listed, but only abnormal results are displayed) Labs Reviewed - No data to display  EKG None  Radiology DG Knee Complete 4 Views Left  Result Date: 12/16/2019 CLINICAL DATA:  Fall, laceration to left knee. EXAM: LEFT KNEE - COMPLETE 4+ VIEW COMPARISON:  None. FINDINGS: Soft tissue laceration noted anteriorly over the inferior  patella. No acute bony abnormality. Specifically, no fracture, subluxation, or dislocation. No joint effusion. No radiopaque foreign body. IMPRESSION: Anterior soft tissue laceration.  No acute bony abnormality. Electronically Signed   By: Charlett Nose M.D.   On: 12/16/2019 13:32    Procedures .Marland KitchenLaceration Repair  Date/Time: 12/16/2019 2:52 PM Performed by: Bill Salinas, PA-C Authorized by: Bill Salinas, PA-C   Consent:    Consent obtained:  Verbal   Consent given by:  Patient   Risks discussed:  Infection, need for additional  repair, nerve damage, poor wound healing, poor cosmetic result, pain, retained foreign body, tendon damage and vascular damage Anesthesia (see MAR for exact dosages):    Anesthesia method:  Topical application and local infiltration   Topical anesthetic:  LET   Local anesthetic:  Lidocaine 1% WITH epi Laceration details:    Location:  Leg   Leg location:  L knee   Length (cm):  4.5   Depth (mm):  5 Repair type:    Repair type:  Simple Pre-procedure details:    Preparation:  Patient was prepped and draped in usual sterile fashion and imaging obtained to evaluate for foreign bodies Exploration:    Hemostasis achieved with:  Epinephrine and direct pressure   Wound exploration: wound explored through full range of motion and entire depth of wound probed and visualized     Wound extent: no foreign bodies/material noted, no muscle damage noted, no nerve damage noted, no tendon damage noted, no underlying fracture noted and no vascular damage noted     Contaminated: no   Treatment:    Area cleansed with:  Saline and Betadine   Amount of cleaning:  Standard   Irrigation solution:  Sterile saline   Irrigation method:  Pressure wash Skin repair:    Repair method:  Sutures   Suture size:  4-0   Suture material:  Prolene   Suture technique:  Simple interrupted   Number of sutures:  7 Approximation:    Approximation:  Close Post-procedure details:     Dressing:  Non-adherent dressing, sterile dressing and antibiotic ointment   Patient tolerance of procedure:  Tolerated well, no immediate complications Comments:     Dressing applied by nursing staff   (including critical care time)  Medications Ordered in ED Medications  lidocaine-EPINEPHrine (XYLOCAINE-EPINEPHrine) 1 %-1:200000 (PF) injection 10 mL (has no administration in time range)  lidocaine-EPINEPHrine-tetracaine (LET) topical gel (3 mLs Topical Given 12/16/19 1340)  Tdap (BOOSTRIX) injection 0.5 mL (0.5 mLs Intramuscular Given 12/16/19 1335)  oxyCODONE-acetaminophen (PERCOCET/ROXICET) 5-325 MG per tablet 1 tablet (1 tablet Oral Given 12/16/19 1331)    ED Course  I have reviewed the triage vital signs and the nursing notes.  Pertinent labs & imaging results that were available during my care of the patient were reviewed by me and considered in my medical decision making (see chart for details).    MDM Rules/Calculators/A&P                     DG Left Knee:  IMPRESSION:  Anterior soft tissue laceration. No acute bony abnormality.  I have personally reviewed patient's left knee x-ray and agree with radiologist interpretation.  No fracture dislocation or foreign body by my interpretation. 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 male who presents to ED for laceration of the left knee that occurred around 1 PM today.  He denies any other injuries or concerns today.  No evidence of head, neck, chest, back, abdomen or pelvis injury or injury of the other 3 extremities. NVI distal to injury. Tdap updated as patient reports his was previously out of date. Wound thoroughly cleaned in ED today. Wound explored and bottom of wound seen in a bloodless field. Laceration repaired as dictated above.  No antibiotics indicated at this time.  Patient provided with knee immobilizer and crutches, referred to Ortho for follow-up if knee pain continues.  Post repair patient was able to flex and extend his knee  without difficulty, patella tendon is intact.  No dehiscence with range of motion.  No evidence of gross ligamentous laxity, infection or other emergent processes at this time. Patient counseled on home wound care. Follow up with PCP/urgent care or return to ER for suture removal in 8-10 days.  At this time there does not appear to be any evidence of an acute emergency medical condition and the patient appears stable for discharge with appropriate outpatient follow up. Diagnosis was discussed with patient who verbalizes understanding of care plan and is agreeable to discharge. I have discussed return precautions with patient who verbalizes understanding. Patient encouraged to follow-up with their PCP and ortho. All questions answered.  Patient's laceration was also evaluated by Dr. Particia Nearing who agrees with repair and outpatient follow-up.  Note: Portions of this report may have been transcribed using voice recognition software. Every effort was made to ensure accuracy; however, inadvertent computerized transcription errors may still be present. Final Clinical Impression(s) / ED Diagnoses Final diagnoses:  Laceration of left knee, initial encounter    Rx / DC Orders ED Discharge Orders    None       Elizabeth Palau 12/16/19 1458    Jacalyn Lefevre, MD 12/17/19 [phone]

## 2019-12-16 NOTE — ED Triage Notes (Signed)
Pt presents to ED with laceration to left knee. Pt states he was coming up steps, fell and hit it on the metal steps.

## 2019-12-25 ENCOUNTER — Ambulatory Visit: Payer: Self-pay | Admitting: Orthopaedic Surgery

## 2019-12-25 ENCOUNTER — Encounter: Payer: Self-pay | Admitting: Orthopaedic Surgery

## 2020-12-09 IMAGING — DX DG KNEE COMPLETE 4+V*L*
4 series · 4 of 4 positions shown · non-contrast
Comparison: None.

CLINICAL DATA: Fall, laceration to left knee.

EXAM:
LEFT KNEE - COMPLETE 4+ VIEW

[knee ap]
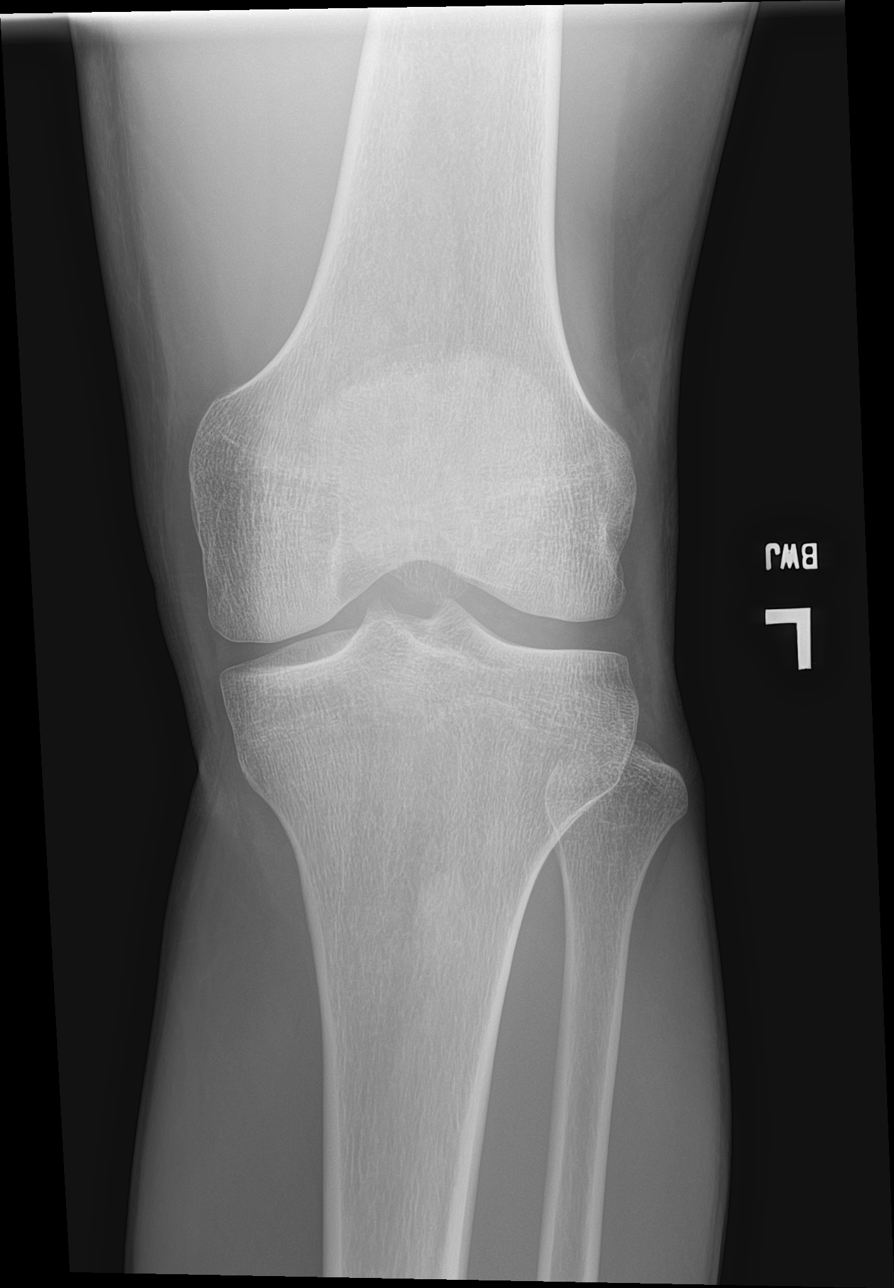

[knee lat]
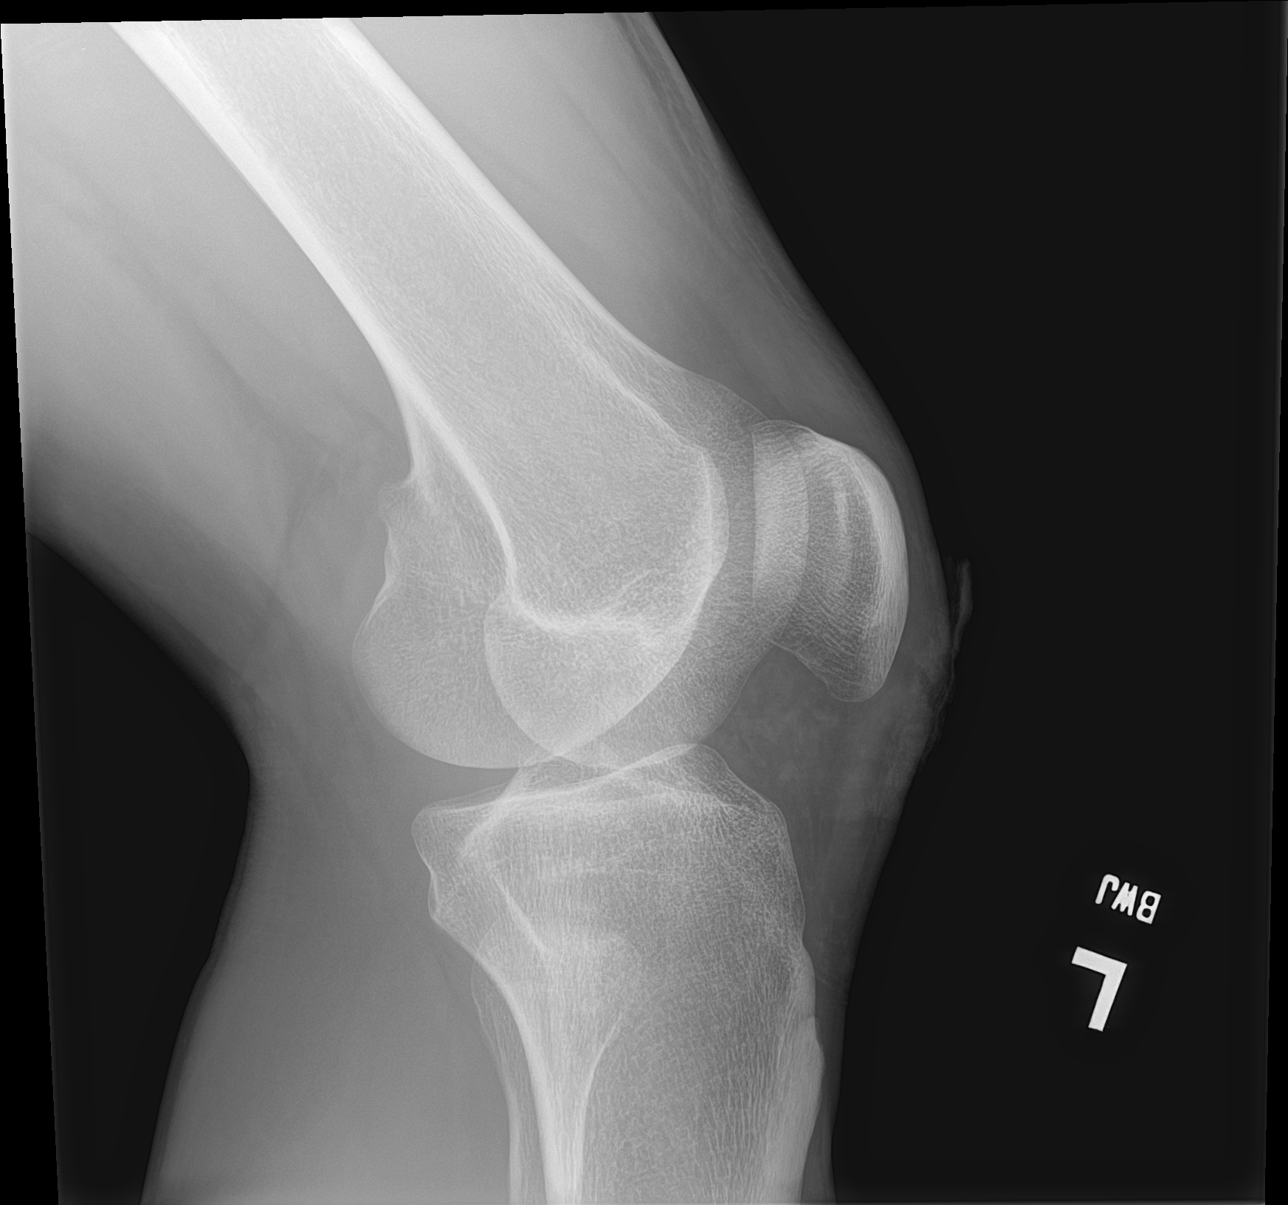

[knee obl (1 of 2)]
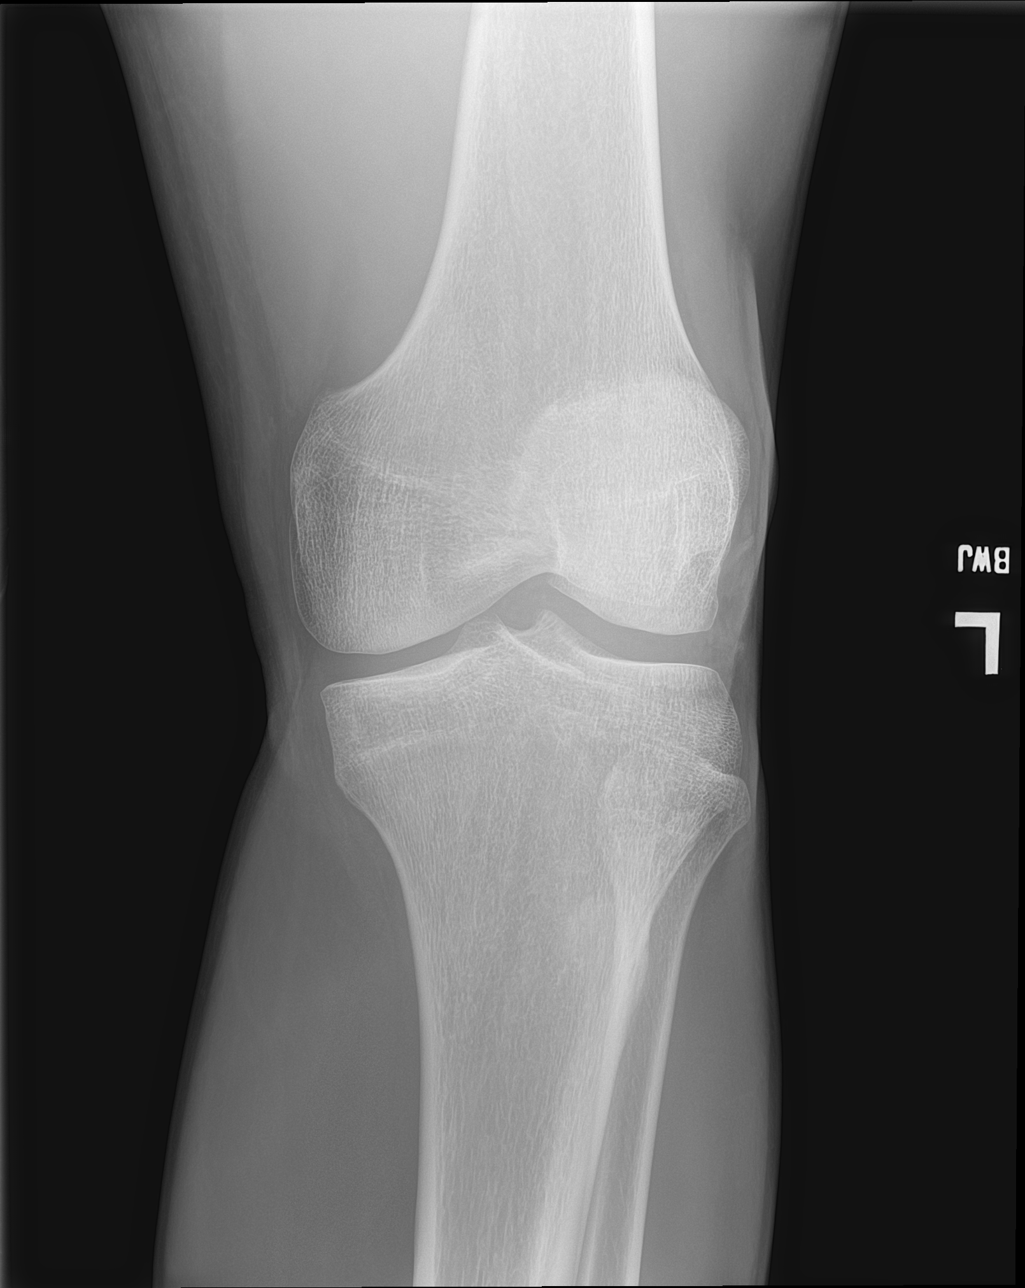

[knee obl (2 of 2)]
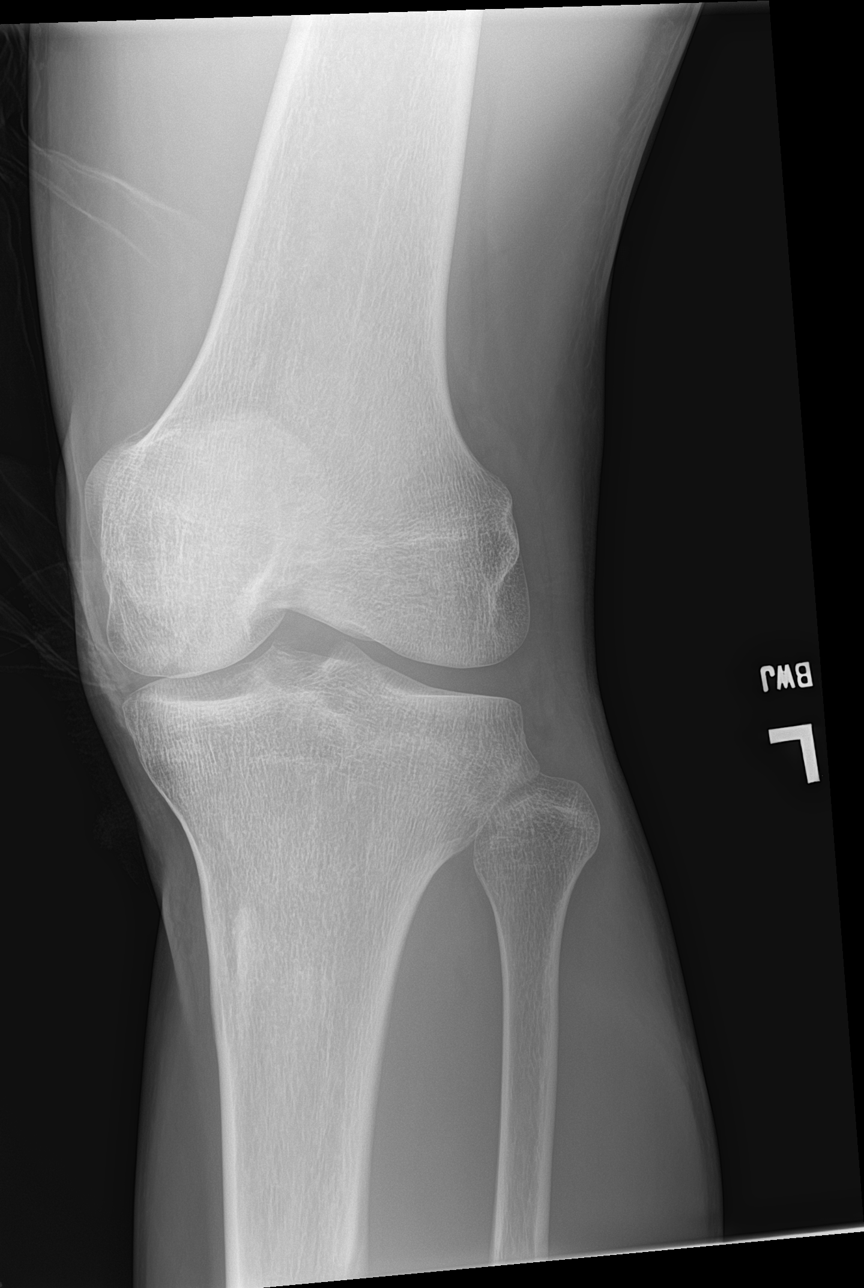

[4 of 4 positions shown; findings below may reference images not displayed]

FINDINGS: Soft tissue laceration noted anteriorly over the inferior patella.
No acute bony abnormality. Specifically, no fracture, subluxation,
or dislocation. No joint effusion. No radiopaque foreign body.
IMPRESSION: Anterior soft tissue laceration.  No acute bony abnormality.

## 2022-12-04 ENCOUNTER — Emergency Department (HOSPITAL_COMMUNITY): Admission: EM | Admit: 2022-12-04 | Discharge: 2022-12-04 | Discharge status: Against Medical Advice | Payer: Self-pay

## 2022-12-04 NOTE — ED Notes (Signed)
Registration reported that pt left
